# Patient Record
Sex: Female | Born: 1985 | Race: Black or African American | Hispanic: No | Marital: Single | Smoking: Current every day smoker
Health system: Southern US, Community
[De-identification: ages and names within clinical notes are randomized; demographics above are authoritative.]

## PROBLEM LIST (undated history)

## (undated) DIAGNOSIS — J45909 Unspecified asthma, uncomplicated: Secondary | ICD-10-CM

---

## 2014-05-26 ENCOUNTER — Emergency Department (HOSPITAL_BASED_OUTPATIENT_CLINIC_OR_DEPARTMENT_OTHER)
Admission: EM | Admit: 2014-05-26 | Discharge: 2014-05-26 | Disposition: A | Discharge status: Home / Self Care | Payer: Self-pay | Attending: Emergency Medicine | Admitting: Emergency Medicine

## 2014-05-26 ENCOUNTER — Emergency Department (HOSPITAL_BASED_OUTPATIENT_CLINIC_OR_DEPARTMENT_OTHER): Payer: Self-pay

## 2014-05-26 ENCOUNTER — Encounter (HOSPITAL_BASED_OUTPATIENT_CLINIC_OR_DEPARTMENT_OTHER): Payer: Self-pay | Admitting: *Deleted

## 2014-05-26 DIAGNOSIS — J45998 Other asthma: Secondary | ICD-10-CM | POA: Insufficient documentation

## 2014-05-26 DIAGNOSIS — Z7951 Long term (current) use of inhaled steroids: Secondary | ICD-10-CM | POA: Insufficient documentation

## 2014-05-26 DIAGNOSIS — B349 Viral infection, unspecified: Secondary | ICD-10-CM | POA: Insufficient documentation

## 2014-05-26 DIAGNOSIS — R059 Cough, unspecified: Secondary | ICD-10-CM

## 2014-05-26 DIAGNOSIS — R05 Cough: Secondary | ICD-10-CM

## 2014-05-26 DIAGNOSIS — Z720 Tobacco use: Secondary | ICD-10-CM | POA: Insufficient documentation

## 2014-05-26 HISTORY — DX: Unspecified asthma, uncomplicated: J45.909

## 2014-05-26 MED ORDER — GUAIFENESIN 100 MG/5ML PO LIQD: 100.0000 mg | ORAL | Status: AC | PRN

## 2014-05-26 NOTE — ED Provider Notes (Signed)
CSN: [id number]     Arrival date & time 05/26/14  2125 History  This chart was scribed for Richardean Canalavid H Providence Stivers, MD by Lionel DecemberHatice Demirci, ED Scribe. This patient was seen in room MH12/MH12 and the patient's care was started at 10:14 PM.    Chief Complaint  Patient presents with  . Cough    The history is provided by the patient. No language interpreter was used.    HPI Comments: Ellen HenriSherry Holian is a 28 y.o. female who presents to the Emergency Department complaining of a cough and congestion with runny nose for the last couple of days.  Feels hot and sweaty at night. She reports sore throat. Notes brownish greenish phlegm. Takes part in social smoking about once every two weeks. Does not have seasonal allergies. She denies a fever.   Past Medical History  Diagnosis Date  . Asthma    History reviewed. No pertinent past surgical history. No family history on file. History  Substance Use Topics  . Smoking status: Current Every Day Smoker -- 0.50 packs/day    Types: Cigarettes  . Smokeless tobacco: Not on file  . Alcohol Use: No   OB History    No data available     Review of Systems  Constitutional: Negative for fever.  HENT: Positive for congestion, rhinorrhea and sore throat.   Respiratory: Positive for cough.   Allergic/Immunologic: Negative for environmental allergies.  All other systems reviewed and are negative.     Allergies  Sulfa antibiotics  Home Medications   Prior to Admission medications   Medication Sig Start Date End Date Taking? Authorizing Provider  ALBUTEROL IN Inhale into the lungs.   Yes Historical Provider, MD   BP 133/92 mmHg  Pulse 110  Temp(Src) 98.6 F (37 C) (Oral)  Resp 18  Ht 5\' 9"  (1.753 m)  Wt 219 lb (99.338 kg)  BMI 32.33 kg/m2  SpO2 99%  LMP 05/24/2014 Physical Exam  Constitutional: She is oriented to person, place, and time. She appears well-developed and well-nourished.  HENT:  Head: Normocephalic and atraumatic.  TMs nl, No sinus  tenderness  OP clear   Eyes: Conjunctivae are normal.  Neck: Normal range of motion. Neck supple.  Cardiovascular: Normal rate, regular rhythm and normal heart sounds.   Pulmonary/Chest: Effort normal and breath sounds normal. No respiratory distress. She has no wheezes. She has no rales.  Abdominal: Soft. She exhibits no distension. There is no tenderness. There is no rebound and no guarding.  Musculoskeletal: Normal range of motion.  Lymphadenopathy:    She has no cervical adenopathy.  Neurological: She is alert and oriented to person, place, and time.  Skin: Skin is warm and dry.  Psychiatric: She has a normal mood and affect.  Nursing note and vitals reviewed.   ED Course  Procedures (including critical care time) DIAGNOSTIC STUDIES: Oxygen Saturation is 99% on RA, normal by my interpretation.    COORDINATION OF CARE: 10:19 PM Discussed treatment plan with patient at beside, the patient agrees with the plan and has no further questions at this time.  Labs Review Labs Reviewed - No data to display  Imaging Review Dg Chest 2 View  05/26/2014   CLINICAL DATA:  Cough.  EXAM: CHEST  2 VIEW  COMPARISON:  None.  FINDINGS: Normal heart size and mediastinal contours. No acute infiltrate or edema. No effusion or pneumothorax. Mild upper thoracic levoscoliosis. No acute osseous findings.  IMPRESSION: 1. No active cardiopulmonary disease. 2. Mild upper thoracic scoliosis.  Electronically Signed   By: Tiburcio PeaJonathan  Watts M.D.   On: 05/26/2014 22:03     EKG Interpretation None      MDM   Final diagnoses:  Cough    Ellen HenriSherry Utter is a 28 y.o. female here with cough and congestion. Likely viral syndrome. Slightly tachy but afebrile. No signs of PE. Told her to take tylenol, motrin. She request prescription of robitussin for cough, which I prescribed. Stable for d/c.   I personally performed the services described in this documentation, which was scribed in my presence. The recorded  information has been reviewed and is accurate.   Richardean Canalavid H Chandlar Staebell, MD 05/29/14 [phone]

## 2014-05-26 NOTE — ED Notes (Signed)
Productive cough with brown sputum x 3 days.

## 2014-05-26 NOTE — Discharge Instructions (Signed)
Take robitussin as needed for cough.   Try over the counter nasal decongestants such as afrin.   Take tylenol, motrin for pain.   Stay hydrated  Follow up with your doctor.   Return to ER if you have worse cough, fever, trouble breathing.

## 2015-07-07 IMAGING — CR DG CHEST 2V
2 series · 2 of 2 positions shown · non-contrast
Comparison: None.

CLINICAL DATA: Cough.

EXAM:
CHEST  2 VIEW

[w chest pa]
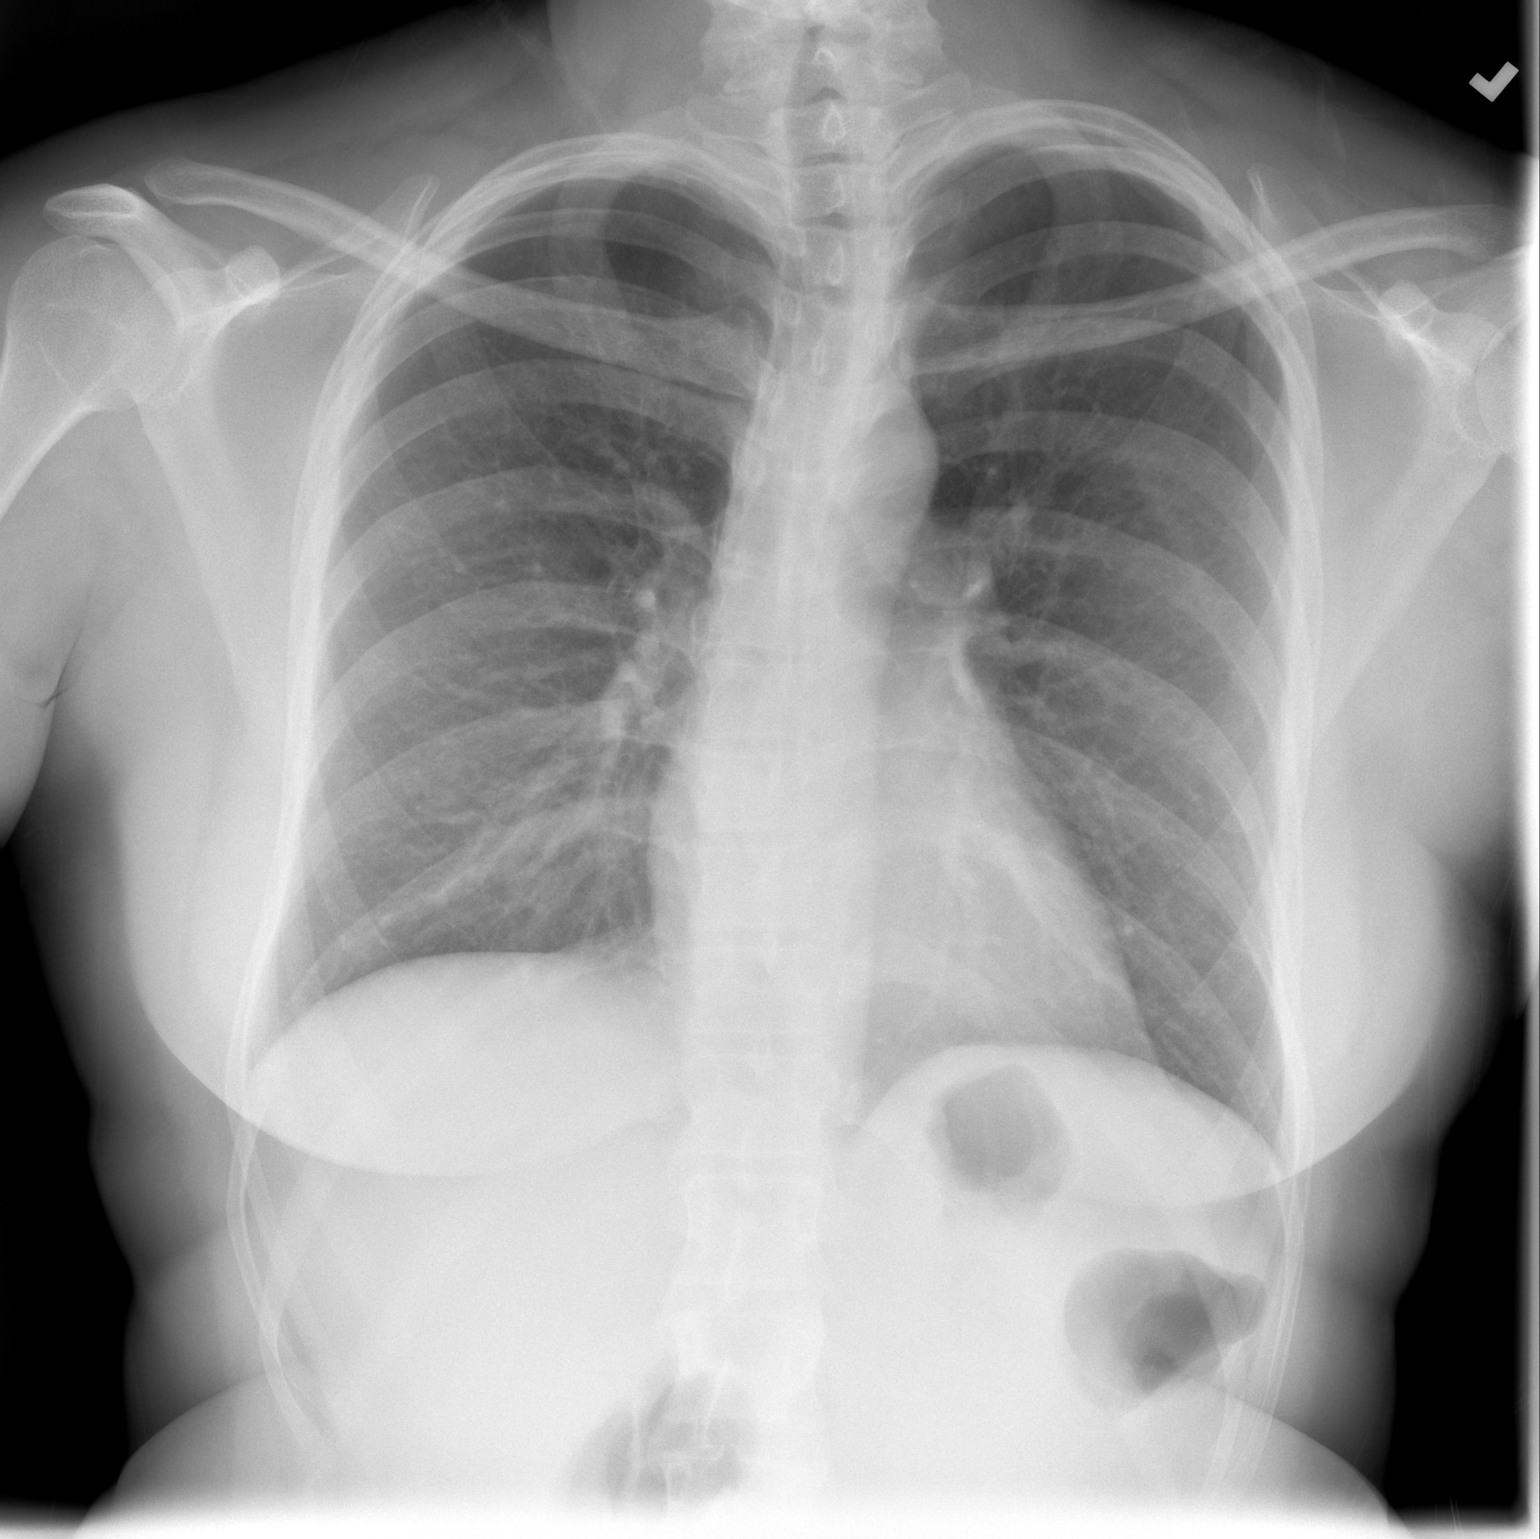

[w chest lat]
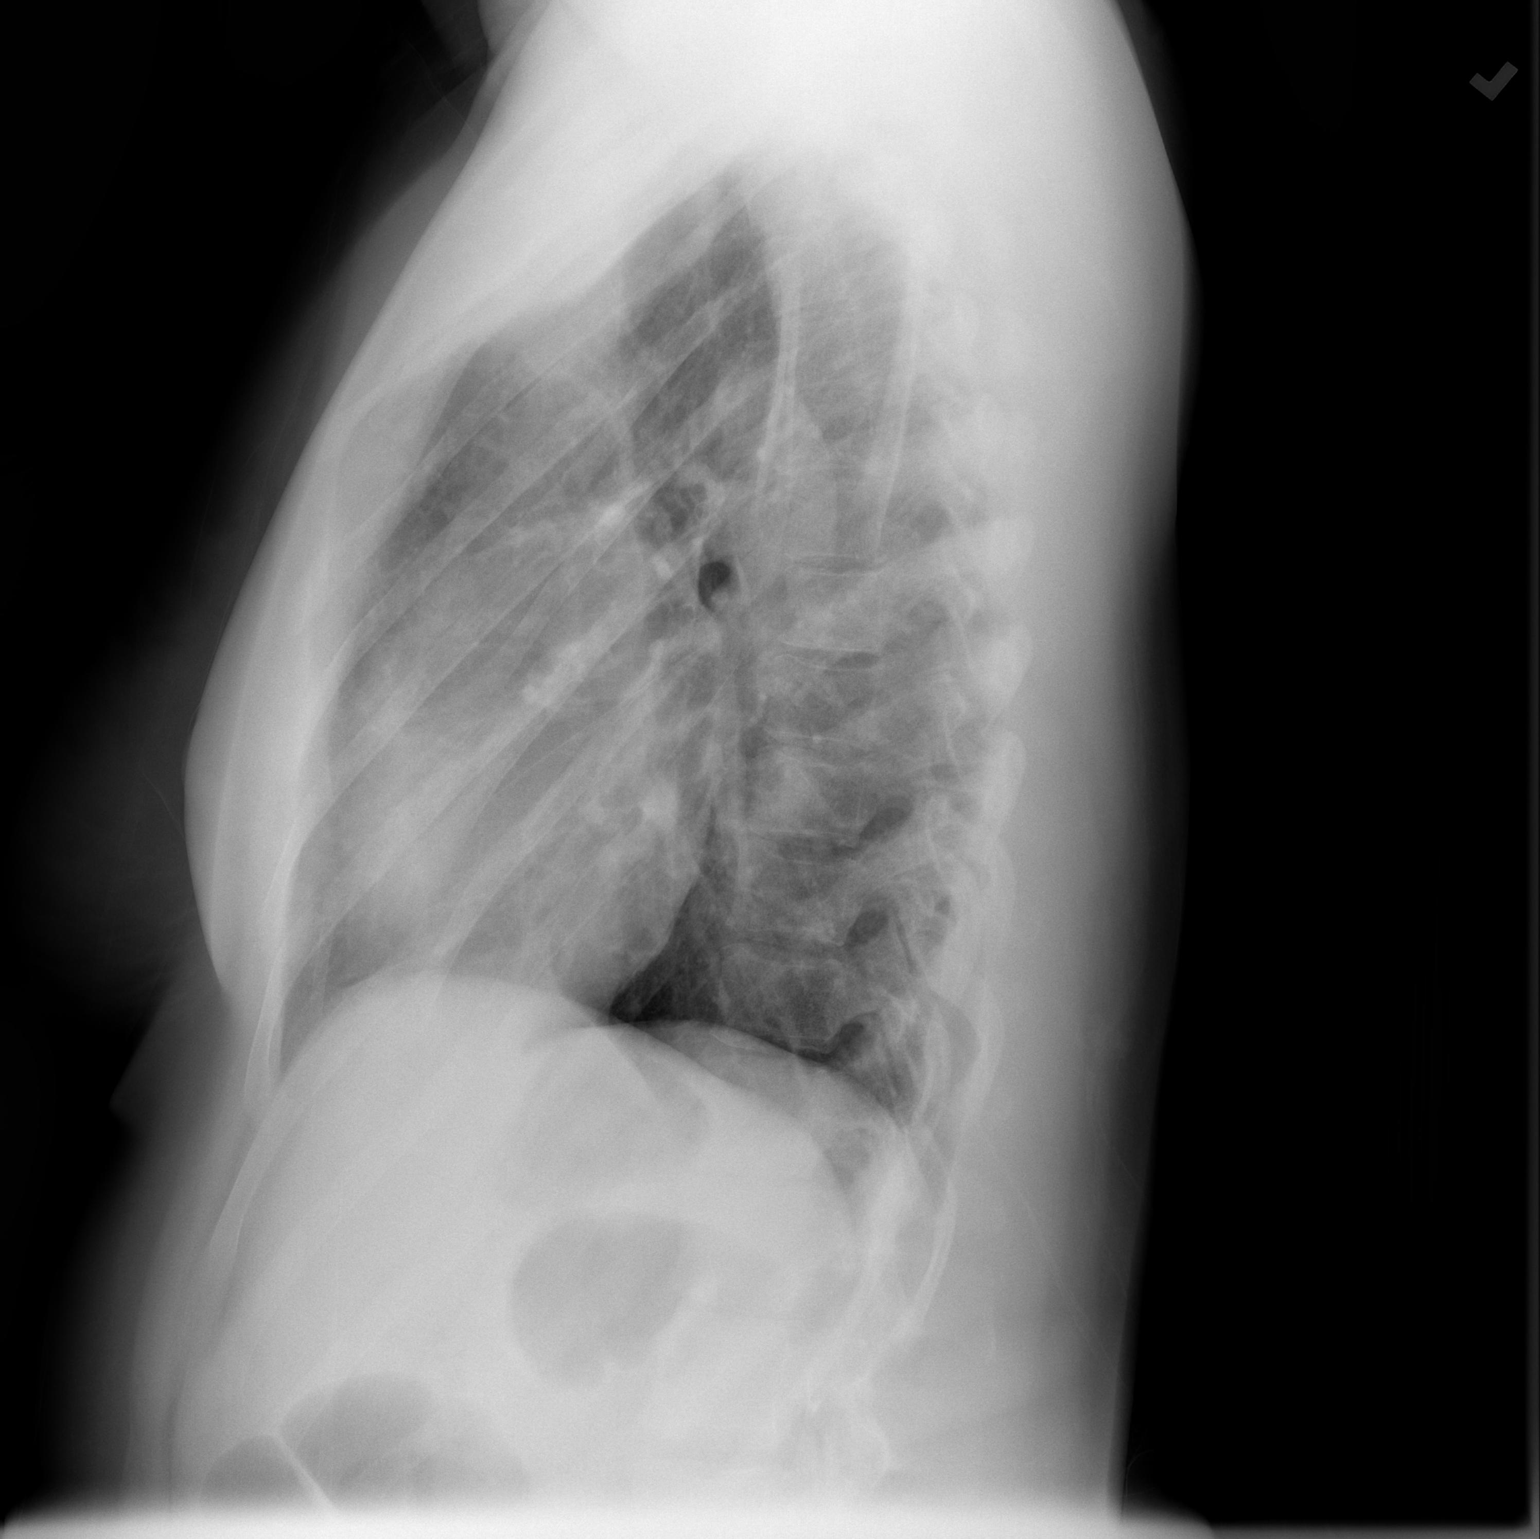

[2 of 2 positions shown; findings below may reference images not displayed]

FINDINGS: Normal heart size and mediastinal contours. No acute infiltrate or
edema. No effusion or pneumothorax. Mild upper thoracic
levoscoliosis. No acute osseous findings.
IMPRESSION: 1. No active cardiopulmonary disease.
2. Mild upper thoracic scoliosis.
